# Patient Record
Sex: Male | Born: 1995 | Race: White | Hispanic: No | Marital: Single | Smoking: Never smoker
Health system: Southern US, Community
[De-identification: ages and names within clinical notes are randomized; demographics above are authoritative.]

## PROBLEM LIST (undated history)

## (undated) DIAGNOSIS — N200 Calculus of kidney: Secondary | ICD-10-CM

## (undated) DIAGNOSIS — K219 Gastro-esophageal reflux disease without esophagitis: Secondary | ICD-10-CM

## (undated) DIAGNOSIS — F329 Major depressive disorder, single episode, unspecified: Secondary | ICD-10-CM

## (undated) DIAGNOSIS — K589 Irritable bowel syndrome without diarrhea: Secondary | ICD-10-CM

## (undated) DIAGNOSIS — F419 Anxiety disorder, unspecified: Secondary | ICD-10-CM

## (undated) DIAGNOSIS — F32A Depression, unspecified: Secondary | ICD-10-CM

## (undated) DIAGNOSIS — T7840XA Allergy, unspecified, initial encounter: Secondary | ICD-10-CM

## (undated) DIAGNOSIS — E785 Hyperlipidemia, unspecified: Secondary | ICD-10-CM

## (undated) HISTORY — DX: Calculus of kidney: N20.0

## (undated) HISTORY — DX: Major depressive disorder, single episode, unspecified: F32.9

## (undated) HISTORY — PX: NO PAST SURGERIES: SHX2092

## (undated) HISTORY — DX: Anxiety disorder, unspecified: F41.9

## (undated) HISTORY — DX: Irritable bowel syndrome without diarrhea: K58.9

## (undated) HISTORY — DX: Depression, unspecified: F32.A

## (undated) HISTORY — DX: Gastro-esophageal reflux disease without esophagitis: K21.9

## (undated) HISTORY — DX: Allergy, unspecified, initial encounter: T78.40XA

## (undated) HISTORY — DX: Hyperlipidemia, unspecified: E78.5

---
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
## 2016-01-13 ENCOUNTER — Ambulatory Visit: Payer: 59 | Admitting: Family Medicine

## 2016-02-10 ENCOUNTER — Ambulatory Visit (INDEPENDENT_AMBULATORY_CARE_PROVIDER_SITE_OTHER): Payer: 59 | Admitting: Family Medicine

## 2016-02-10 ENCOUNTER — Encounter: Payer: Self-pay | Admitting: Family Medicine

## 2016-02-10 VITALS — BP 126/82 | HR 78 | Temp 98.3°F | Ht 65.0 in | Wt 133.5 lb

## 2016-02-10 DIAGNOSIS — Z0000 Encounter for general adult medical examination without abnormal findings: Secondary | ICD-10-CM | POA: Diagnosis not present

## 2016-02-10 DIAGNOSIS — Z139 Encounter for screening, unspecified: Secondary | ICD-10-CM

## 2016-02-10 DIAGNOSIS — Z1389 Encounter for screening for other disorder: Secondary | ICD-10-CM

## 2016-02-10 LAB — POCT URINALYSIS DIPSTICK
BILIRUBIN UA: NEGATIVE
Blood, UA: NEGATIVE
GLUCOSE UA: NEGATIVE
Ketones, UA: NEGATIVE
LEUKOCYTES UA: NEGATIVE
Nitrite, UA: NEGATIVE
Protein, UA: NEGATIVE
Spec Grav, UA: >=1.03
UROBILINOGEN UA: NEGATIVE
pH, UA: 6

## 2016-02-10 NOTE — Assessment & Plan Note (Signed)
Immunizations up to date. Regular exercise. Nonsmoker. Has had HIV screening. UA obtained today given ROS and for screening (was on college form). Form filled out for college today.

## 2016-02-10 NOTE — Progress Notes (Signed)
Subjective:  Patient ID: Shane Wu, male    DOB: [birth year]  Age: 20 y.o. MRN: [id number]  CC: Establish care/physical exam for school  HPI Shane SnareMatthew Wu is a 20 y.o. male presents to the clinic today to establish care. He is in need of a physical as he is transferring colleges.   Preventative Healthcare  Immunizations - Up to date per [place] database.  Exercise: Reports regular exercise.  Alcohol use: See below.  Smoking/tobacco use: No  STD/HIV testing: No concerns. Has had screening previously.   Regular dental exams: Yes.  Wears seat belt: Yes.  PMH, Surgical Hx, Family Hx, Social History reviewed and updated as below.  Past Medical History  Diagnosis Date  . Depression   . GERD (gastroesophageal reflux disease)   . Hyperlipidemia   . Kidney stones    Past Surgical History  Procedure Laterality Date  . No past surgeries     Family History  Problem Relation Age of Onset  . Mental illness Mother   . Mental illness Maternal Grandmother   . Mental illness Maternal Grandfather   . Hypertension Paternal Grandmother   . Diabetes Cousin    Social History  Substance Use Topics  . Smoking status: Never Smoker   . Smokeless tobacco: Never Used  . Alcohol Use: 0.6 - 1.2 oz/week    1-2 Standard drinks or equivalent per week   Review of Systems  Respiratory: Positive for chest tightness and shortness of breath.   Cardiovascular: Positive for palpitations.  Genitourinary: Positive for frequency.  All other systems reviewed and are negative.  Objective:   Today's Vitals: BP 126/82 mmHg  Pulse 78  Temp(Src) 98.3 F (36.8 C) (Oral)  Ht 5\' 5"  (1.651 m)  Wt 133 lb 8 oz (60.555 kg)  BMI 22.22 kg/m2  SpO2 97%  Physical Exam  Constitutional: He is oriented to person, place, and time. He appears well-developed and well-nourished. No distress.  HENT:  Head: Normocephalic and atraumatic.  Nose: Nose normal.  Mouth/Throat: Oropharynx is clear and moist. No  oropharyngeal exudate.  Normal TM's bilaterally.   Eyes: Conjunctivae are normal. No scleral icterus.  Neck: Neck supple. No thyromegaly present.  Cardiovascular: Normal rate and regular rhythm.   No murmur heard. Pulmonary/Chest: Effort normal and breath sounds normal. He has no wheezes. He has no rales.  Abdominal: Soft. He exhibits no distension. There is no tenderness. There is no rebound and no guarding.  Musculoskeletal: Normal range of motion. He exhibits no edema.  Lymphadenopathy:    He has no cervical adenopathy.  Neurological: He is alert and oriented to person, place, and time.  Skin: Skin is warm and dry. No rash noted.  Psychiatric: He has a normal mood and affect.  Vitals reviewed.  Assessment & Plan:   Problem List Items Addressed This Visit    Preventative health care    Immunizations up to date. Regular exercise. Nonsmoker. Has had HIV screening. UA obtained today given ROS and for screening (was on college form). Form filled out for college today.        Other Visit Diagnoses    Screening for blood or protein in urine    -  Primary    Relevant Orders    POCT Urinalysis Dipstick (Completed)       No outpatient encounter prescriptions on file as of 02/10/2016.   No facility-administered encounter medications on file as of 02/10/2016.    Follow-up: Annually/PRN  Everlene OtherJayce Marshall Roehrich DO [facility] [place]  [facility]

## 2016-02-10 NOTE — Progress Notes (Signed)
Pre visit review using our clinic review tool, if applicable. No additional management support is needed unless otherwise documented below in the visit note. 

## 2016-05-11 ENCOUNTER — Emergency Department: Payer: 59

## 2016-05-11 ENCOUNTER — Telehealth: Payer: Self-pay | Admitting: *Deleted

## 2016-05-11 ENCOUNTER — Emergency Department
Admission: EM | Admit: 2016-05-11 | Discharge: 2016-05-11 | Disposition: A | Discharge status: Home / Self Care | Payer: 59 | Attending: Emergency Medicine | Admitting: Emergency Medicine

## 2016-05-11 DIAGNOSIS — R109 Unspecified abdominal pain: Secondary | ICD-10-CM

## 2016-05-11 DIAGNOSIS — R112 Nausea with vomiting, unspecified: Secondary | ICD-10-CM | POA: Insufficient documentation

## 2016-05-11 DIAGNOSIS — R1031 Right lower quadrant pain: Secondary | ICD-10-CM | POA: Diagnosis not present

## 2016-05-11 LAB — CBC
HCT: 44.7 % (ref 40.0–52.0)
Hemoglobin: 15.6 g/dL (ref 13.0–18.0)
MCH: 28.5 pg (ref 26.0–34.0)
MCHC: 34.9 g/dL (ref 32.0–36.0)
MCV: 81.9 fL (ref 80.0–100.0)
PLATELETS: 298 10*3/uL (ref 150–440)
RBC: 5.46 MIL/uL (ref 4.40–5.90)
RDW: 12.6 % (ref 11.5–14.5)
WBC: 10.5 10*3/uL (ref 3.8–10.6)

## 2016-05-11 LAB — URINALYSIS COMPLETE WITH MICROSCOPIC (ARMC ONLY)
BACTERIA UA: NONE SEEN
BILIRUBIN URINE: NEGATIVE
GLUCOSE, UA: NEGATIVE mg/dL
HGB URINE DIPSTICK: NEGATIVE
Ketones, ur: NEGATIVE mg/dL
LEUKOCYTES UA: NEGATIVE
NITRITE: NEGATIVE
Protein, ur: NEGATIVE mg/dL
SPECIFIC GRAVITY, URINE: 1.027 (ref 1.005–1.030)
Squamous Epithelial / LPF: NONE SEEN
pH: 6 (ref 5.0–8.0)

## 2016-05-11 LAB — BASIC METABOLIC PANEL
Anion gap: 11 (ref 5–15)
BUN: 19 mg/dL (ref 6–20)
CHLORIDE: 101 mmol/L (ref 101–111)
CO2: 24 mmol/L (ref 22–32)
CREATININE: 1.28 mg/dL — AB (ref 0.61–1.24)
Calcium: 9.6 mg/dL (ref 8.9–10.3)
GFR calc Af Amer: >60 mL/min (ref >60)
GFR calc non Af Amer: >60 mL/min (ref >60)
GLUCOSE: 137 mg/dL — AB (ref 65–99)
Potassium: 3.5 mmol/L (ref 3.5–5.1)
SODIUM: 136 mmol/L (ref 135–145)

## 2016-05-11 MED ORDER — SODIUM CHLORIDE 0.9 % IV BOLUS (SEPSIS)
1000.0000 mL | Freq: Once | INTRAVENOUS | Status: AC
  Administered 2016-05-11: 1000 mL via INTRAVENOUS

## 2016-05-11 MED ORDER — KETOROLAC TROMETHAMINE 30 MG/ML IJ SOLN
30.0000 mg | Freq: Once | INTRAMUSCULAR | Status: AC
  Administered 2016-05-11: 30 mg via INTRAVENOUS

## 2016-05-11 MED ORDER — ONDANSETRON HCL 4 MG/2ML IJ SOLN
4.0000 mg | Freq: Once | INTRAMUSCULAR | Status: AC
  Administered 2016-05-11: 4 mg via INTRAVENOUS

## 2016-05-11 NOTE — ED Triage Notes (Signed)
Pt arrives to ER via POV c/o right sided flank pain sharp and stabbing. Denies urinary sx. Nausea and vomiting. Pt alert and oriented X4, active, cooperative, pt in NAD. RR even and unlabored, color WNL.

## 2016-05-11 NOTE — ED Provider Notes (Signed)
[facility] Emergency Department Provider Note  ____________________________________________  Time seen: Approximately 9:47 PM  I have reviewed the triage vital signs and the nursing notes.   HISTORY  Chief Complaint Flank Pain   HPI Shane Wu is a 20 y.o. male no significant past medical history who presents for evaluation of right flank pain. Patient reports that the pain started suddenly at [facility]. He characterizes the pain as sharp, constant, radiating to his right lower abdomen, 7-8/10. He reports multiple episodes of dry heaving and nausea. He denies hematuria, dysuria, fever, constipation, diarrhea. Patient has had a similar episode in 2015 when he was seen here but at that time had a CT scan with no evidence of a kidney stone. Patient reports that he was in his usual state of health until the pain started with normal appetite today.  Past Medical History:  Diagnosis Date  . Depression   . GERD (gastroesophageal reflux disease)   . Hyperlipidemia   . Kidney stones     Patient Active Problem List   Diagnosis Date Noted  . Preventative health care 02/10/2016    Past Surgical History:  Procedure Laterality Date  . NO PAST SURGERIES      Prior to Admission medications   Not on File    Allergies Review of patient's allergies indicates no known allergies.  Family History  Problem Relation Age of Onset  . Mental illness Mother   . Mental illness Maternal Grandmother   . Mental illness Maternal Grandfather   . Hypertension Paternal Grandmother   . Diabetes Cousin     Social History Social History  Substance Use Topics  . Smoking status: Never Smoker  . Smokeless tobacco: Never Used  . Alcohol use 0.6 - 1.2 oz/week    1 - 2 Standard drinks or equivalent per week    Review of Systems  Constitutional: Negative for fever. Eyes: Negative for visual changes. ENT: Negative for sore throat. Cardiovascular: Negative for chest  pain. Respiratory: Negative for shortness of breath. Gastrointestinal: Negative for abdominal pain,  Diarrhea. + N and vomiting Genitourinary: Negative for dysuria. + R flank pain Musculoskeletal: Negative for back pain. Skin: Negative for rash. Neurological: Negative for headaches, weakness or numbness.  ____________________________________________   PHYSICAL EXAM:  VITAL SIGNS: ED Triage Vitals  Enc Vitals Group     BP 05/11/16 2131 140/76     Pulse Rate 05/11/16 2131 (!) 101     Resp 05/11/16 2131 18     Temp 05/11/16 2131 97.6 F (36.4 C)     Temp Source 05/11/16 2131 Oral     SpO2 05/11/16 2131 98 %     Weight 05/11/16 2131 130 lb (59 kg)     Height 05/11/16 2131 5\' 5"  (1.651 m)     Head Circumference --      Peak Flow --      Pain Score 05/11/16 2132 7     Pain Loc --      Pain Edu? --      Excl. in GC? --     Constitutional: Alert and oriented. Well appearing and in no apparent distress. HEENT:      Head: Normocephalic and atraumatic.         Eyes: Conjunctivae are normal. Sclera is non-icteric. EOMI. PERRL      Mouth/Throat: Mucous membranes are moist.       Neck: Supple with no signs of meningismus. Cardiovascular: Regular rate and rhythm. No murmurs, gallops, or rubs.  2+ symmetrical distal pulses are present in all extremities. No JVD. Respiratory: Normal respiratory effort. Lungs are clear to auscultation bilaterally. No wheezes, crackles, or rhonchi.  Gastrointestinal: Soft, mild ttp over the RLQ, and non distended with positive bowel sounds. No rebound or guarding. Genitourinary: R CVA tenderness. Descended testes with no swelling or erythema of the scrotum, no tenderness over the testicles, positive bilateral cremasteric reflex, no evidence of inguinal hernia Musculoskeletal: Nontender with normal range of motion in all extremities. No edema, cyanosis, or erythema of extremities. Neurologic: Normal speech and language. Face is symmetric. Moving all  extremities. No gross focal neurologic deficits are appreciated. Skin: Skin is warm, dry and intact. No rash noted. Psychiatric: Mood and affect are normal. Speech and behavior are normal.  ____________________________________________   LABS (all labs ordered are listed, but only abnormal results are displayed)  Labs Reviewed  URINALYSIS COMPLETEWITH MICROSCOPIC ([facility] ONLY) - Abnormal; Notable for the following:       Result Value   Color, Urine YELLOW (*)    APPearance CLEAR (*)    All other components within normal limits  BASIC METABOLIC PANEL - Abnormal; Notable for the following:    Glucose, Bld 137 (*)    Creatinine, Ser 1.28 (*)    All other components within normal limits  URINE CULTURE  CBC   ____________________________________________  EKG  none  ____________________________________________  RADIOLOGY  CT renal:  Negative ____________________________________________   PROCEDURES  Procedure(s) performed: None Procedures Critical Care performed:  None ____________________________________________   INITIAL IMPRESSION / ASSESSMENT AND PLAN / ED COURSE  20 y.o. male no significant past medical history who presents for evaluation of sudden onset right flank pain radiating to the RLQ associated with nausea. Presentation concerning for kidney stone and less likely appendicitis. Plan for labs, UA, CT renal protocol. Will treat symptoms with IVF, IV zofran, and IV toradol.   Clinical Course  Comment By Time  CT negative. Labs and urine negative. Pain has resolved with toradol. Serial abdominal exmas with no tenderness, no guarding or rebound and completely benign abdomen. Plan to dc home with close re-check in the morning by PCP. Recommended return to the ER if pain returns, if he develops abdominal pain, fever, testicular pain or swelling or any new symptoms. Parents and patient comfortable with plan. Nita Sickle, MD 07/31 2258    Pertinent labs & imaging  results that were available during my care of the patient were reviewed by me and considered in my medical decision making (see chart for details).    ____________________________________________   FINAL CLINICAL IMPRESSION(S) / ED DIAGNOSES  Final diagnoses:  Right flank pain      NEW MEDICATIONS STARTED DURING THIS VISIT:  New Prescriptions   No medications on file     Note:  This document was prepared using Dragon voice recognition software and may include unintentional dictation errors.    Nita Sickle, MD 05/11/16 [phone]

## 2016-05-11 NOTE — ED Notes (Signed)
Patient transported to CT 

## 2016-05-11 NOTE — Telephone Encounter (Signed)
Patients mother requested pt bee seen for depression. She stated that pt has no been taking his medication properly and he has agreed to start the medication. Please give a time and date on Dr. Adriana Simas schedule to place pt for a office visit before August 11

## 2016-05-11 NOTE — Telephone Encounter (Signed)
30min please.

## 2016-05-11 NOTE — Telephone Encounter (Signed)
Shane Wu can you assist, I am not sure which slots are best to combine based on there requests for there template, thanks

## 2016-05-11 NOTE — Discharge Instructions (Signed)

## 2016-05-11 NOTE — Telephone Encounter (Signed)
Please advise if this can be a slot or needs to be 30? Thanks

## 2016-05-13 ENCOUNTER — Ambulatory Visit (INDEPENDENT_AMBULATORY_CARE_PROVIDER_SITE_OTHER): Payer: 59 | Admitting: Family Medicine

## 2016-05-13 ENCOUNTER — Encounter: Payer: Self-pay | Admitting: Family Medicine

## 2016-05-13 VITALS — BP 121/83 | HR 68 | Temp 98.2°F | Wt 130.0 lb

## 2016-05-13 DIAGNOSIS — F329 Major depressive disorder, single episode, unspecified: Secondary | ICD-10-CM

## 2016-05-13 DIAGNOSIS — R109 Unspecified abdominal pain: Secondary | ICD-10-CM

## 2016-05-13 DIAGNOSIS — F32A Depression, unspecified: Secondary | ICD-10-CM | POA: Insufficient documentation

## 2016-05-13 DIAGNOSIS — R10A1 Flank pain, right side: Secondary | ICD-10-CM | POA: Insufficient documentation

## 2016-05-13 LAB — POCT URINALYSIS DIPSTICK
BILIRUBIN UA: NEGATIVE
Blood, UA: NEGATIVE
Glucose, UA: NEGATIVE
Ketones, UA: NEGATIVE
LEUKOCYTES UA: NEGATIVE
NITRITE UA: NEGATIVE
PH UA: 6
PROTEIN UA: NEGATIVE
Spec Grav, UA: 1.025
Urobilinogen, UA: 0.2

## 2016-05-13 LAB — URINE CULTURE: Culture: NO GROWTH

## 2016-05-13 MED ORDER — CITALOPRAM HYDROBROMIDE 20 MG PO TABS: 20.0000 mg | ORAL_TABLET | Freq: Every day | ORAL | 1 refills | Status: DC

## 2016-05-13 NOTE — Progress Notes (Signed)
Subjective:  Patient ID: Shane Wu, male    DOB: 1996/02/22  Age: 20 y.o. MRN: [id number]  CC: Depression, Abdominal/flank pain  HPI:  20 year old male with a history of depression presents with the above complaints.  Depression  Patient has had a history of depression in the past.  He was on Zoloft previously and did well.  After doing well subsequently tapered and discontinued.  Recently he has been experiencing changes in his mood. He's been more down and more withdrawn.  He states that his girlfriend is going to college 4 hours away. This is the only new stressor that he's aware of.  He is in the process of switching colleges as well.  His mother has noticed a change in his behavior as well.  He states that he frequently gets upset and cries.  He would like to discuss treatment options today.  Abdominal/flank pain  Patient developed severe right flank pain/right lower abdominal pain on Monday. He had a similar issue in 2015.  He was seen and evaluated in the ED. CT scan was obtained and was negative.  He presents today for follow-up regarding this in addition to the above complaint.  He states that he continues to have pain in the right flank/right lower quadrant as well as the right low back.  Intermittent and mild.  He has had some constipation recently as well. He did have a bowel movement this morning.  No reports of hematuria. No known exacerbating or relieving factors. He had some nausea yesterday. No vomiting.  No other complaints at this time.  Social Hx   Social History   Social History  . Marital status: Single    Spouse name: N/A  . Number of children: N/A  . Years of education: N/A   Social History Main Topics  . Smoking status: Never Smoker  . Smokeless tobacco: Never Used  . Alcohol use 0.6 - 1.2 oz/week    1 - 2 Standard drinks or equivalent per week  . Drug use: No  . Sexual activity: Yes    Partners: Female   Other Topics  Concern  . None   Social History Narrative  . None   Review of Systems  Gastrointestinal: Positive for abdominal pain.  Genitourinary: Positive for flank pain.  Psychiatric/Behavioral:       Depression.   Objective:  BP 121/83 (BP Location: Right Arm, Patient Position: Sitting, Cuff Size: Normal)   Pulse 68   Temp 98.2 F (36.8 C) (Oral)   Wt 130 lb (59 kg)   SpO2 100%   BMI 21.63 kg/m   BP/Weight 05/13/2016 05/11/2016 02/10/2016  Systolic BP 121 132 126  Diastolic BP 83 70 82  Wt. (Lbs) 130 130 133.5  BMI 21.63 21.63 22.22   Physical Exam  Constitutional: He appears well-developed. No distress.  Cardiovascular: Normal rate and regular rhythm.   Pulmonary/Chest: Effort normal and breath sounds normal.  Abdominal: Soft.  Nondistended. Mildly tender palpation in the right lower quadrant. No rebound or guarding. No CVA tenderness.  Psychiatric:  Flat affect.  Vitals reviewed.  Lab Results  Component Value Date   WBC 10.5 05/11/2016   HGB 15.6 05/11/2016   HCT 44.7 05/11/2016   PLT 298 05/11/2016   GLUCOSE 137 (H) 05/11/2016   ALT 20 10/06/2014   AST 11 10/06/2014   NA 136 05/11/2016   K 3.5 05/11/2016   CL 101 05/11/2016   CREATININE 1.28 (H) 05/11/2016   BUN 19 05/11/2016  CO2 24 05/11/2016    Assessment & Plan:   Problem List Items Addressed This Visit    Depression - Primary    Established problem, recurrence. Starting on Celexa.      Relevant Medications   citalopram (CELEXA) 20 MG tablet   Right flank pain    New problem. Urinalysis negative today. Concern for possible stone although CT was negative. Advised supportive care and over-the-counter analgesics. If recurs will send to urology.      Relevant Orders   POCT Urinalysis Dipstick (Completed)    Other Visit Diagnoses   None.    Meds ordered this encounter  Medications  . citalopram (CELEXA) 20 MG tablet    Sig: Take 1 tablet (20 mg total) by mouth daily.    Dispense:  90 tablet      Refill:  1   Follow-up: 6 weeks   Everlene Other DO [facility]

## 2016-05-13 NOTE — Progress Notes (Signed)
Pre visit review using our clinic review tool, if applicable. No additional management support is needed unless otherwise documented below in the visit note. 

## 2016-05-13 NOTE — Assessment & Plan Note (Signed)
Established problem, recurrence. Starting on Celexa.

## 2016-05-13 NOTE — Patient Instructions (Signed)
Let me know if pain persists.  Take the celexa daily.  Follow up in 6 weeks.  Take care  Dr. Adriana Simas

## 2016-05-13 NOTE — Assessment & Plan Note (Signed)
New problem. Urinalysis negative today. Concern for possible stone although CT was negative. Advised supportive care and over-the-counter analgesics. If recurs will send to urology.

## 2016-07-13 ENCOUNTER — Telehealth: Payer: Self-pay | Admitting: Family Medicine

## 2016-07-13 NOTE — Telephone Encounter (Signed)
You can schedule tomorrow in either of the two acute slots I have open. Make for 15 min appt.

## 2016-07-13 NOTE — Telephone Encounter (Signed)
Pt is away at school and will only be home on Friday 10/6.

## 2016-07-13 NOTE — Telephone Encounter (Signed)
Pt mom Selena BattenKim called stating that pt will be home on Friday and would like a to make an appt to come in for some knee pain (mom not sure of which). Also stated that citalopram (CELEXA) 20 MG tablet is giving him some stomach pain and dirrhrea and some occasional vomiting.  If we cannot get him in can we please call in a different medication? Please Advise. Thank you!  Call mom (kim) [phone]

## 2016-07-13 NOTE — Telephone Encounter (Signed)
Patient is scheduled for 1:15 on Friday.

## 2016-07-13 NOTE — Telephone Encounter (Signed)
Put him in the 1115 spot or 115 on that date.

## 2016-07-17 ENCOUNTER — Ambulatory Visit: Payer: Self-pay | Admitting: Family Medicine

## 2016-08-02 IMAGING — CT CT ABD-PELV W/O CM
1 of 4 series · 5 of 46 positions shown, 10 images · non-contrast
Comparison: Right upper quadrant abdominal ultrasound performed
07/18/2013

CLINICAL DATA: Acute onset of left flank pain for 20 minutes.
Vomiting. Initial encounter.

EXAM:
CT ABDOMEN AND PELVIS WITHOUT CONTRAST
TECHNIQUE: Multidetector CT imaging of the abdomen and pelvis was performed
following the standard protocol without IV contrast.

[Series 4: lung windows · axial · 0.61mm/px · z∈[-146,-76]mm · 5 of 22 slices shown, 10 images]
[im 4/22  soft-tissue]
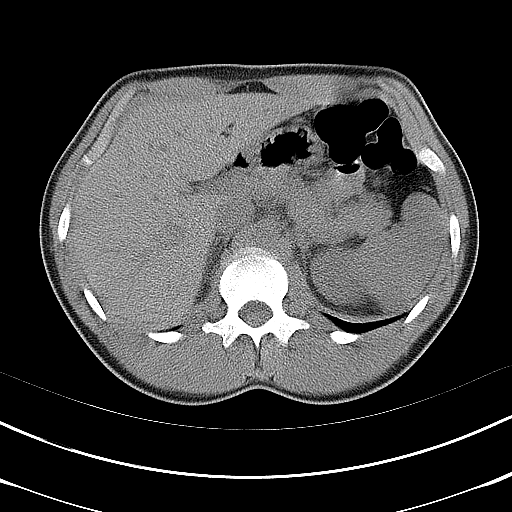
[im 4/22  bone]
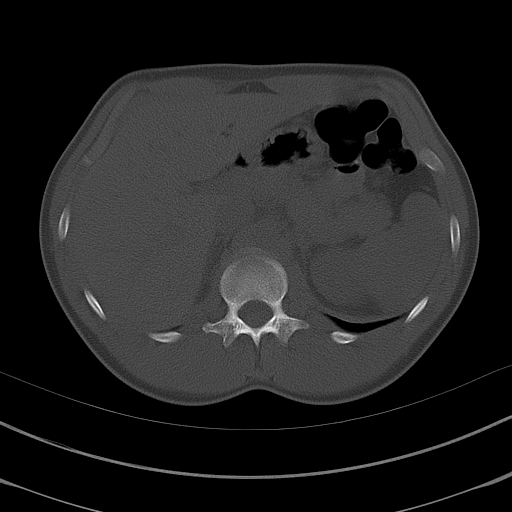
[im 8/22  soft-tissue]
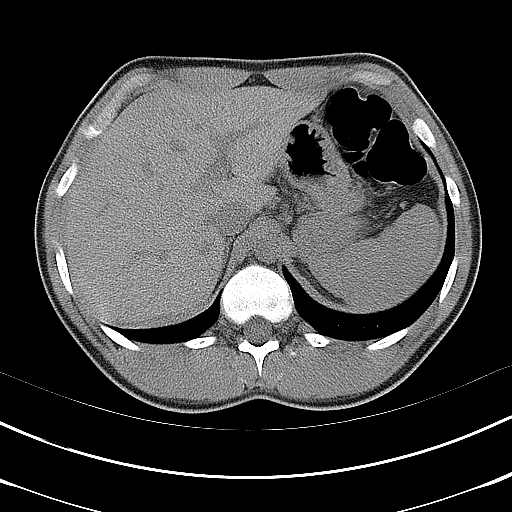
[im 8/22  lung]
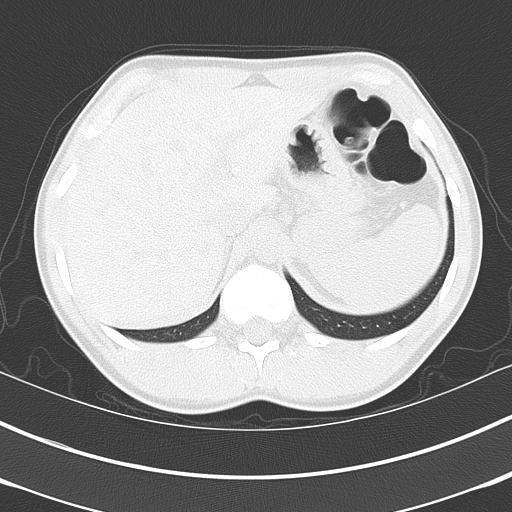
[im 11/22  soft-tissue]
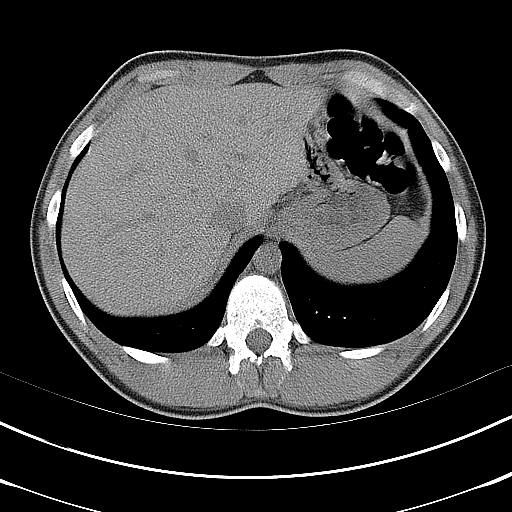
[im 11/22  lung]
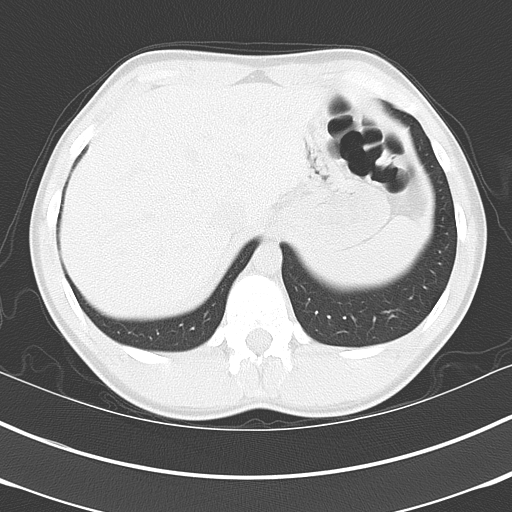
[im 15/22  soft-tissue]
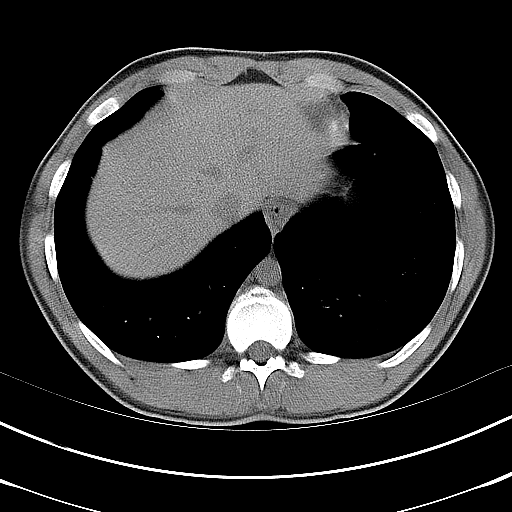
[im 15/22  lung]
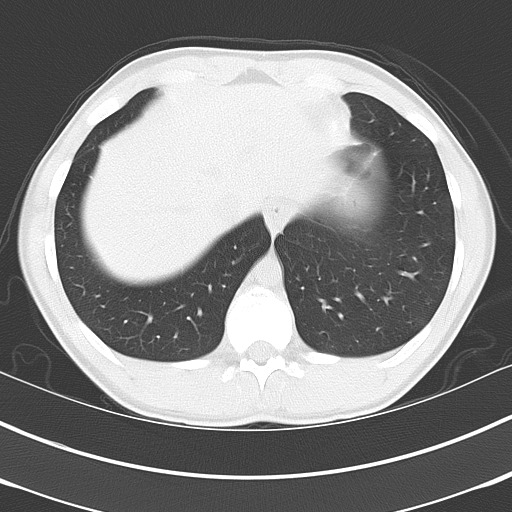
[im 18/22  soft-tissue]
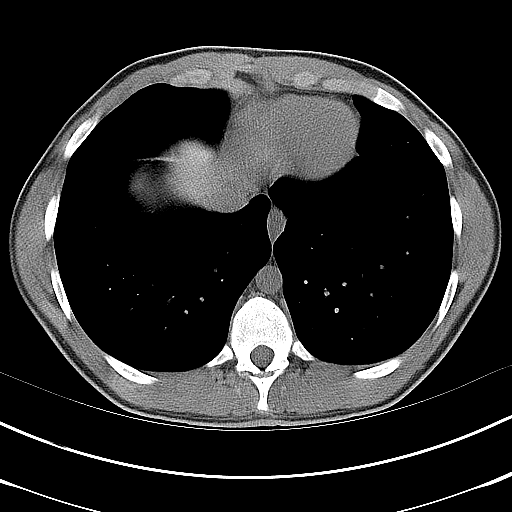
[im 18/22  lung]
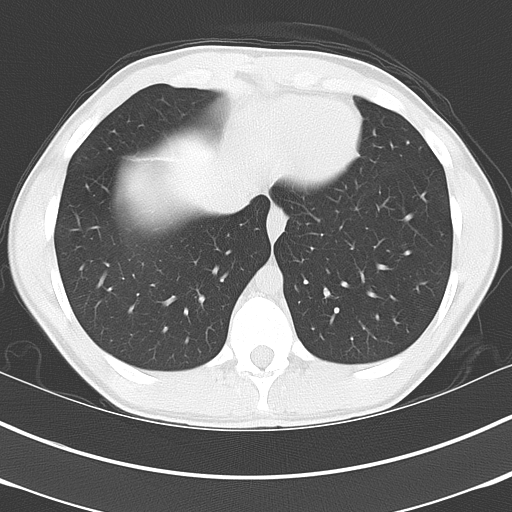

[5 of 46 positions shown; findings below may reference images not displayed]

FINDINGS: The visualized lung bases are clear.

The liver and spleen are unremarkable in appearance. The gallbladder
is within normal limits. The pancreas and adrenal glands are
unremarkable.

The kidneys are unremarkable in appearance. There is no evidence of
hydronephrosis. No renal or ureteral stones are seen. No perinephric
stranding is appreciated.

No free fluid is identified. The small bowel is unremarkable in
appearance. The stomach is within normal limits. No acute vascular
abnormalities are seen.

The appendix is difficult to fully characterize, but there is no
evidence for appendicitis. The colon is unremarkable in appearance.

The bladder is mildly distended and grossly unremarkable. A small
urachal remnant is incidentally seen. The prostate remains normal in
size. No inguinal lymphadenopathy is seen.

No acute osseous abnormalities are identified.
IMPRESSION: Unremarkable noncontrast CT of the abdomen and pelvis.

## 2018-03-08 IMAGING — CT CT RENAL STONE PROTOCOL
2 of 4 series · 15 of 46 positions shown, 17 images · non-contrast
Comparison: CT 10/06/2014

CLINICAL DATA: Sharp right flank pain.

EXAM:
CT ABDOMEN AND PELVIS WITHOUT CONTRAST
TECHNIQUE: Multidetector CT imaging of the abdomen and pelvis was performed
following the standard protocol without IV contrast.

[Series 2: axial st · axial · 0.59mm/px · z∈[-1052,-672]mm · 12 of 84 slices shown, 14 images]
[im 4/84  soft-tissue]
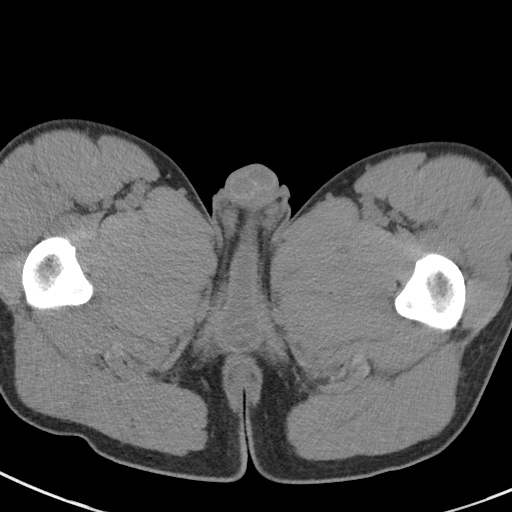
[im 4/84  bone]
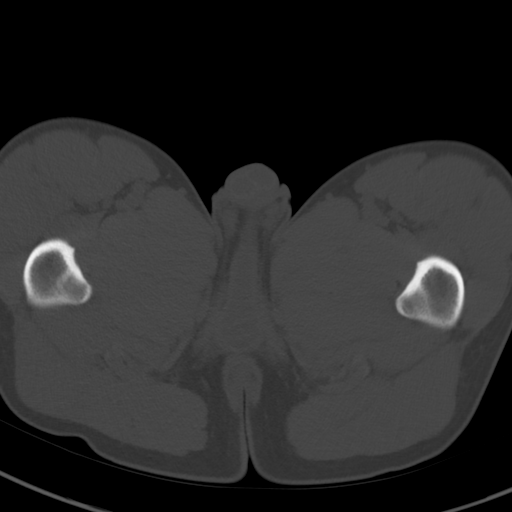
[im 11/84  soft-tissue]
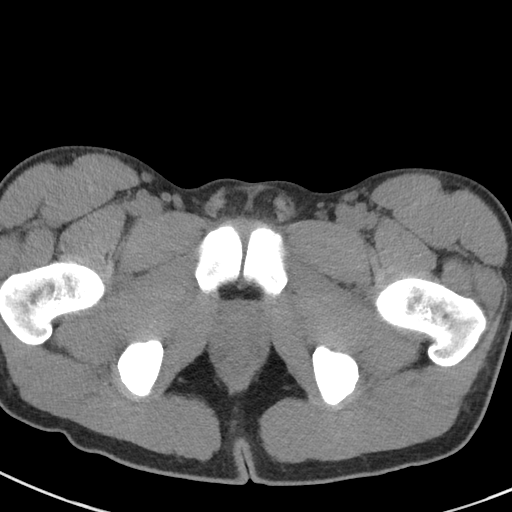
[im 18/84  soft-tissue]
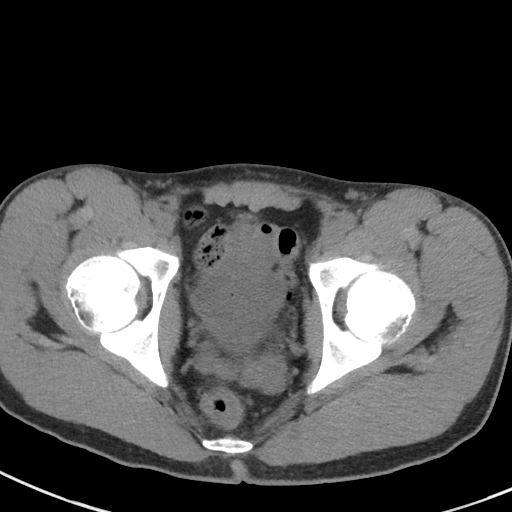
[im 25/84  soft-tissue]
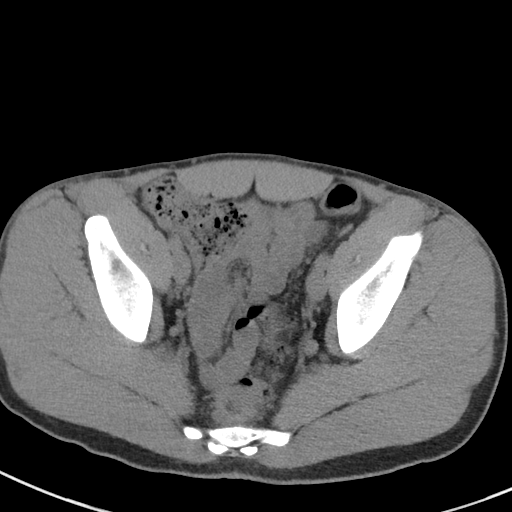
[im 32/84  soft-tissue]
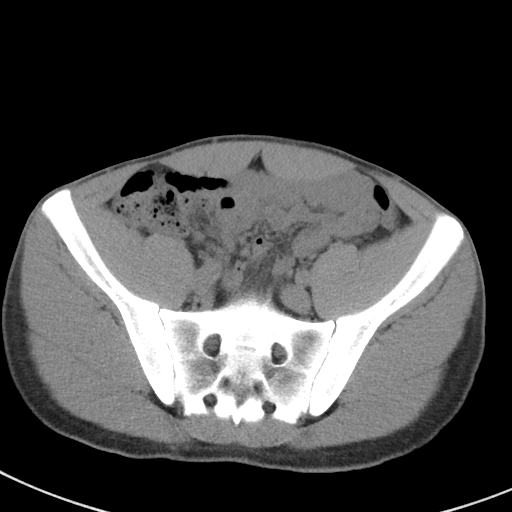
[im 39/84  soft-tissue]
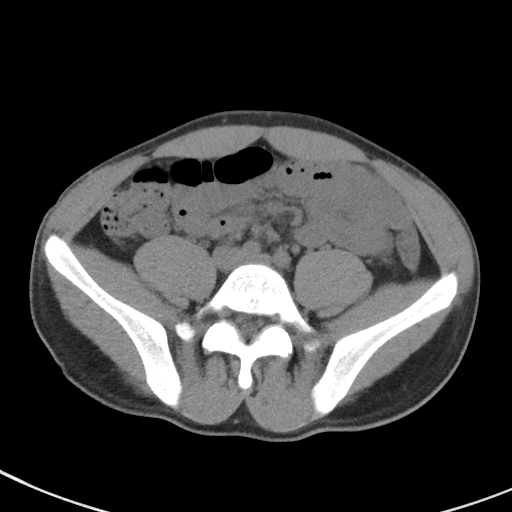
[im 45/84  soft-tissue]
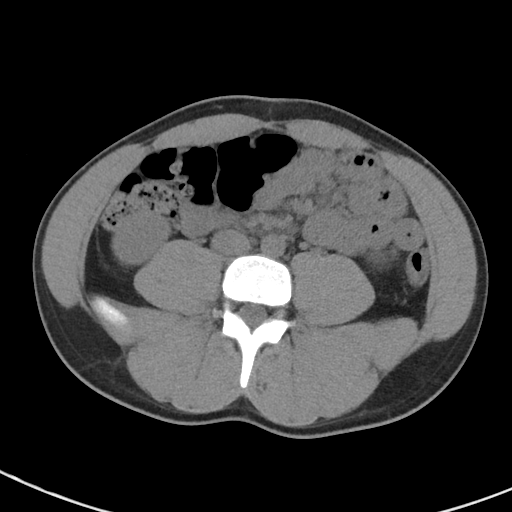
[im 52/84  soft-tissue]
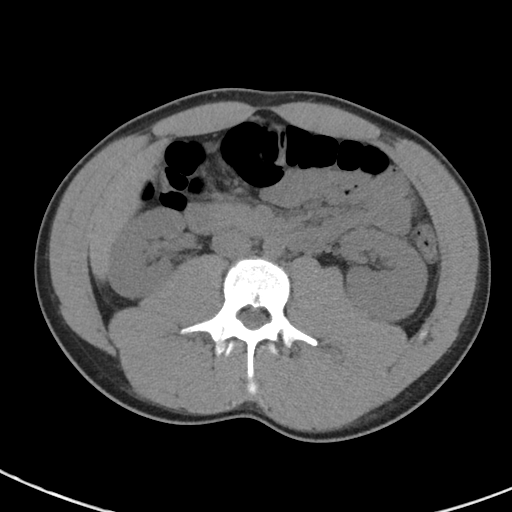
[im 59/84  soft-tissue]
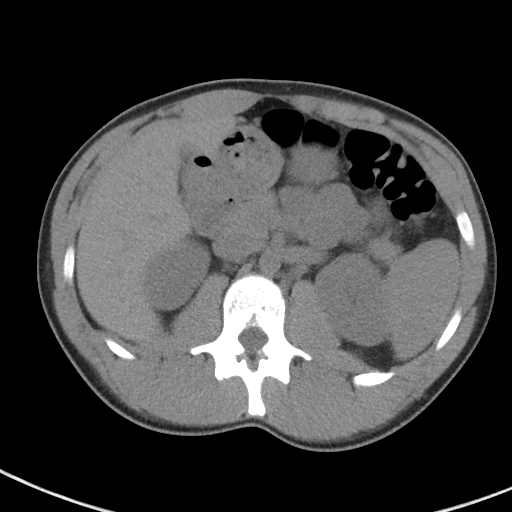
[im 59/84  bone]
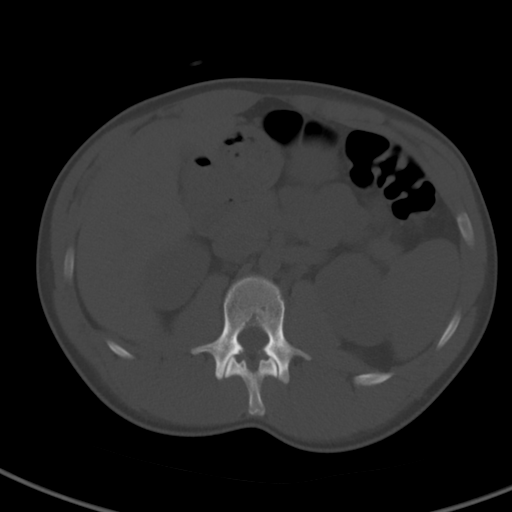
[im 66/84  soft-tissue]
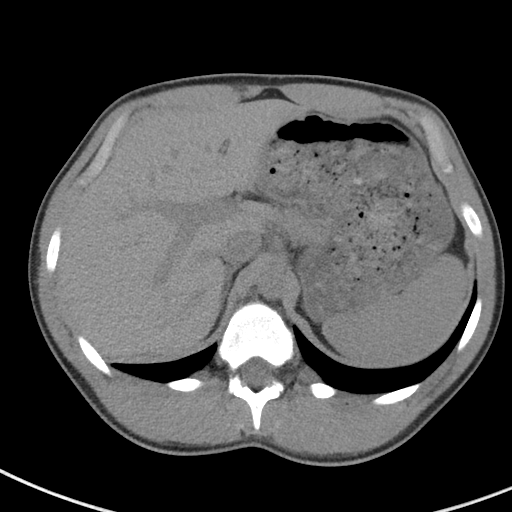
[im 73/84  soft-tissue]
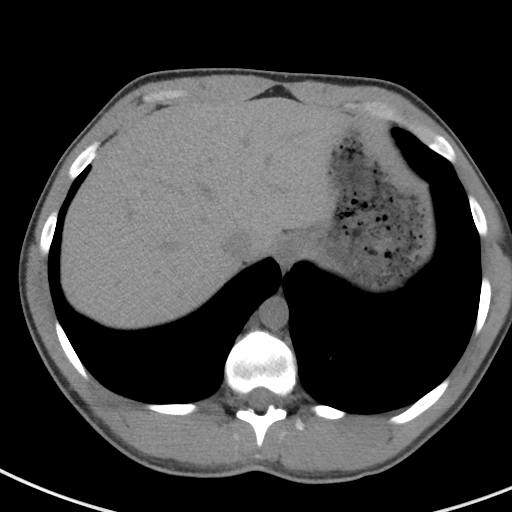
[im 80/84  soft-tissue]
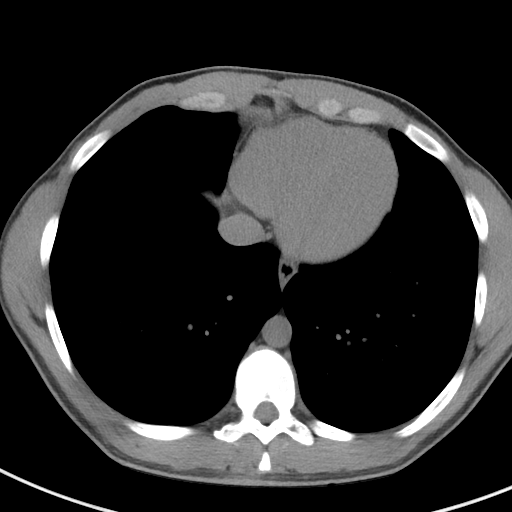

[Series 4: coronal st · coronal · 0.63mm/px · 3 of 82 slices shown]
[im 28/82  soft-tissue]
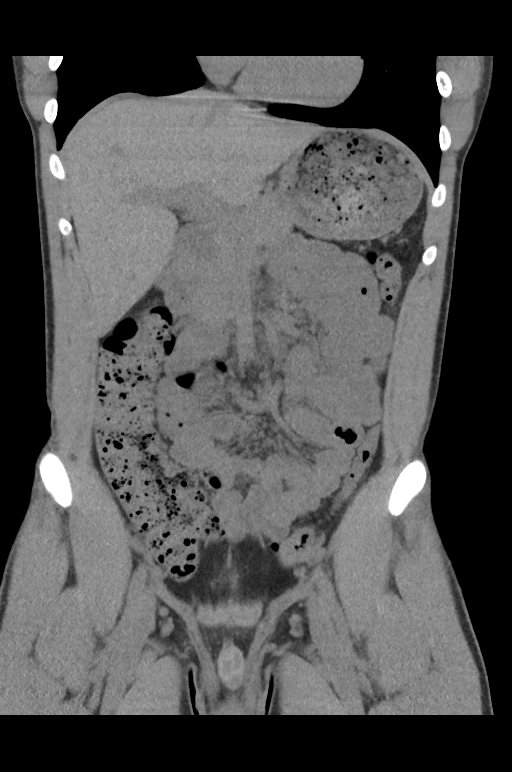
[im 37/82  soft-tissue]
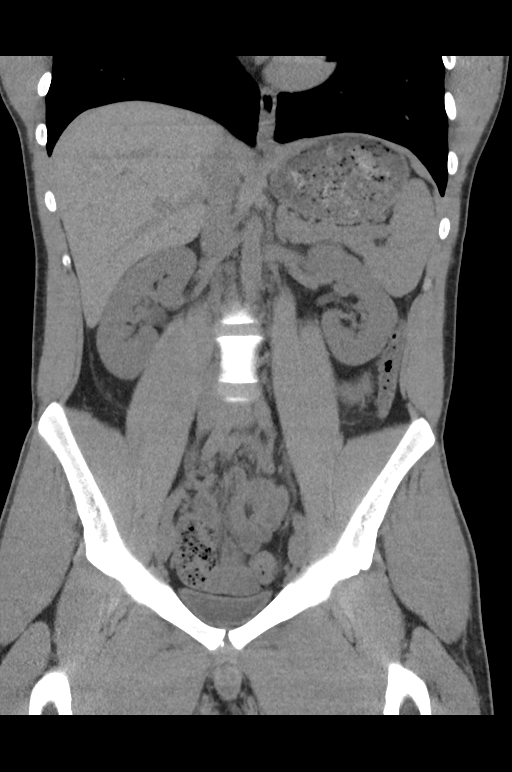
[im 46/82  soft-tissue]
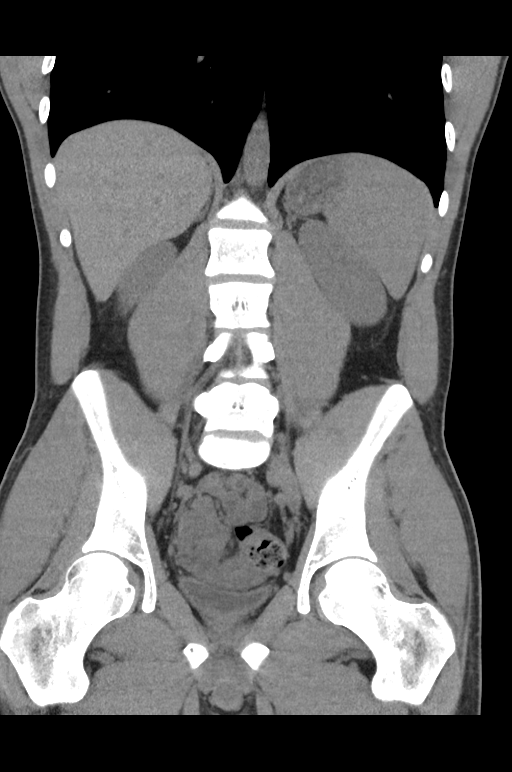

[15 of 46 positions shown; findings below may reference images not displayed]

FINDINGS: Lower chest:  The included lung bases are clear.

Liver: Normal noncontrast appearance.

Hepatobiliary: Gallbladder is decompressed.  No biliary dilatation.

Pancreas: Suboptimally assessed due to lack contrast and paucity of
intra-abdominal fat. No ductal dilatation or inflammation.

Spleen: Normal.

Adrenal glands: No nodule.

Kidneys: Symmetric in size without stones or hydronephrosis. There
is no perinephric stranding. Both ureters are decompressed without
stones along the course.

Stomach/Bowel: Stomach distended with ingested contents. There are
no dilated or thickened small bowel loops. Small volume of stool
throughout the colon without colonic wall thickening. The appendix
is not confidently identified, no pericecal or right lower quadrant
inflammation.

Vascular/Lymphatic: No retroperitoneal adenopathy. Abdominal aorta
is normal in caliber.

Reproductive: No acute abnormality.

Bladder: Minimally distended, no bladder stone. No definite wall
thickening.

Other: No free air, free fluid, or intra-abdominal fluid collection.

Musculoskeletal: There are no acute or suspicious osseous
abnormalities.
IMPRESSION: No renal stones or obstructive uropathy. No acute abnormality in the
abdomen/pelvis.

## 2019-03-21 ENCOUNTER — Ambulatory Visit (INDEPENDENT_AMBULATORY_CARE_PROVIDER_SITE_OTHER): Payer: Managed Care, Other (non HMO) | Admitting: Physician Assistant

## 2019-03-21 ENCOUNTER — Encounter: Payer: Self-pay | Admitting: Physician Assistant

## 2019-03-21 ENCOUNTER — Other Ambulatory Visit: Payer: Self-pay

## 2019-03-21 VITALS — BP 116/70 | HR 78 | Temp 98.1°F | Resp 16 | Ht 65.0 in | Wt 139.0 lb

## 2019-03-21 DIAGNOSIS — F329 Major depressive disorder, single episode, unspecified: Secondary | ICD-10-CM | POA: Diagnosis not present

## 2019-03-21 DIAGNOSIS — F32A Depression, unspecified: Secondary | ICD-10-CM

## 2019-03-21 MED ORDER — BUSPIRONE HCL 7.5 MG PO TABS: 7.5000 mg | ORAL_TABLET | Freq: Two times a day (BID) | ORAL | 0 refills | Status: AC

## 2019-03-21 NOTE — Patient Instructions (Signed)
Persistent Depressive Disorder  Persistent depressive disorder (PDD) is a mental health condition. PDD causes symptoms of low-level depression for 2 years or longer. It may also be called long-term (chronic) depression or dysthymia. PDD may include episodes of more severe depression that last for about 2 weeks (major depressive disorder or MDD). PDD can affect the way you think, feel, and sleep. This condition may also affect your relationships. You may be more likely to get sick if you have PDD. Symptoms of PDD occur for most of the day and may include:  Feeling tired (fatigue).  Low energy.  Eating too much or too little.  Sleeping too much or too little.  Feeling restless or agitated.  Feeling hopeless.  Feeling worthless or guilty.  Feeling worried or nervous (anxiety).  Trouble concentrating or making decisions.  Low self-esteem.  A negative way of looking at things (outlook).  Not being able to have fun or feel pleasure.  Avoiding interacting with people.  Getting angry or annoyed easily (irritability).  Acting aggressive or angry. Follow these instructions at home: Activity  Go back to your normal activities as told by your doctor.  Exercise regularly as told by your doctor. General instructions  Take over-the-counter and prescription medicines only as told by your doctor.  Do not drink alcohol. Or, limit how much alcohol you drink to no more than 1 drink a day for nonpregnant women and 2 drinks a day for men. One drink equals 12 oz of beer, 5 oz of wine, or 1 oz of hard liquor. Alcohol can affect any antidepressant medicines you are taking. Talk with your doctor about your alcohol use.  Eat a healthy diet and get plenty of sleep.  Find activities that you enjoy each day.  Consider joining a support group. Your doctor may be able to suggest a support group.  Keep all follow-up visits as told by your doctor. This is important. Where to find more  information National Alliance on Mental Illness  www.nami.org [place] National Institute of Mental Health  www.nimh.nih.gov National Suicide Prevention Lifeline  (1-800-273-8255).  This is free, 24-hour help. Contact a doctor if:  Your symptoms get worse.  You have new symptoms.  You have trouble sleeping or doing your daily activities. Get help right away if:  You self-harm.  You have serious thoughts about hurting yourself or others.  You see, hear, taste, smell, or feel things that are not there (hallucinate). This information is not intended to replace advice given to you by your health care provider. Make sure you discuss any questions you have with your health care provider. Document Released: 09/09/2015 Document Revised: 05/22/2016 Document Reviewed: 05/22/2016 Elsevier Interactive Patient Education  2019 [employer] Inc.  

## 2019-03-21 NOTE — Progress Notes (Signed)
Patient: Shane Wu, Male    DOB: 25-Sep-1996, 23 y.o.   MRN: [id number] Visit Date: 03/21/2019  Today's Provider: Trey SailorsAdriana M Exilda Wilhite, PA-C   Chief Complaint  Patient presents with  . New Patient (Initial Visit)  . Depression   Subjective:     Annual physical exam Shane Wu is a 23 y.o. male who presents today for health maintenance. Patient c/o depression worsening in the last few weeks. Patient reports that about two years ago was taking a medication for depression.   [place]ives in [place]. [place] in [place] and social [employer] compliance. Living back with parents and working from home. No drugs or history.  Are you in relationship. Relationship with male. Not sexually active currently, has been sexually active in the past.  Had history of depression in college that was on and off. [employer] transferred to [employer]. Reports he was on medication but it made him tired. Took medication for two months. Has taken celexa and zoloft. Reports he had stomach issues and grogginess. Has seen counselor in the past, does wish to see another one but is not sure if he will relocate back to [place].  Reports he has bilateral ear fullness. Reports he takes flonase daily.  -----------------------------------------------------------------   Review of Systems  Constitutional: Positive for appetite change and fatigue.  HENT: Negative.   Eyes: Negative.   Respiratory: Negative.   Cardiovascular: Negative.   Gastrointestinal: Negative.   Endocrine: Negative.   Genitourinary: Negative.   Musculoskeletal: Positive for back pain, neck pain and neck stiffness.  Skin: Negative.   Allergic/Immunologic: Negative.   Neurological: Positive for headaches.  Hematological: Negative.   Psychiatric/Behavioral: Positive for agitation, decreased concentration and sleep disturbance. The patient is nervous/anxious.     Social History      He  reports that he has never smoked.  He has never used smokeless tobacco. He reports current alcohol use of about 1.0 - 2.0 standard drinks of alcohol per week. He reports that he does not use drugs.       Social History   Socioeconomic History  . Marital status: Single    Spouse name: Not on file  . Number of children: Not on file  . Years of education: Not on file  . Highest education level: Not on file  Occupational History  . Not on file  Social Needs  . Financial resource strain: Not on file  . Food insecurity:    Worry: Not on file    Inability: Not on file  . Transportation needs:    Medical: Not on file    Non-medical: Not on file  Tobacco Use  . Smoking status: Never Smoker  . Smokeless tobacco: Never Used  Substance and Sexual Activity  . Alcohol use: Yes    Alcohol/week: 1.0 - 2.0 standard drinks    Types: 1 - 2 Standard drinks or equivalent per week  . Drug use: No  . Sexual activity: Yes    Partners: Female  Lifestyle  . Physical activity:    Days per week: Not on file    Minutes per session: Not on file  . Stress: Not on file  Relationships  . Social connections:    Talks on phone: Not on file    Gets together: Not on file    Attends religious service: Not on file    Active member of club or organization: Not on file    Attends meetings of  clubs or organizations: Not on file    Relationship status: Not on file  Other Topics Concern  . Not on file  Social History Narrative  . Not on file    Past Medical History:  Diagnosis Date  . Allergy   . Anxiety   . Depression   . GERD (gastroesophageal reflux disease)   . Hyperlipidemia   . IBS (irritable bowel syndrome)   . Kidney stones      Patient Active Problem List   Diagnosis Date Noted  . Depression 05/13/2016  . Right flank pain 05/13/2016  . Preventative health care 02/10/2016    Past Surgical History:  Procedure Laterality Date  . NO PAST SURGERIES      Family History        Family Status  Relation Name Status  .  Mother  Alive  . MGM  (Not Specified)  . MGF  (Not Specified)  . PGM  (Not Specified)  . Cousin  (Not Specified)  . Father  Alive        His family history includes Depression in his maternal grandmother and mother; Diabetes in his cousin; Hypertension in his paternal grandmother; Mental illness in his maternal grandfather, maternal grandmother, and mother.      No Known Allergies  No current outpatient medications on file.   Patient Care Team: Maryella ShiversPollak, Carless Slatten M, PA-C as PCP - General (Physician Assistant)    Objective:    Vitals: BP 116/70 (BP Location: Left Arm, Patient Position: Sitting, Cuff Size: Normal)   Pulse 78   Temp 98.1 F (36.7 C) (Oral)   Resp 16   Ht 5\' 5"  (1.651 m)   Wt 139 lb (63 kg)   BMI 23.13 kg/m    Vitals:   03/21/19 1537  BP: 116/70  Pulse: 78  Resp: 16  Temp: 98.1 F (36.7 C)  TempSrc: Oral  Weight: 139 lb (63 kg)  Height: 5\' 5"  (1.651 m)     Physical Exam Constitutional:      Appearance: Normal appearance.  HENT:     Right Ear: Tympanic membrane and ear canal normal. There is no impacted cerumen.     Left Ear: Tympanic membrane and ear canal normal. There is no impacted cerumen.  Cardiovascular:     Rate and Rhythm: Normal rate and regular rhythm.     Heart sounds: Normal heart sounds.  Pulmonary:     Effort: Pulmonary effort is normal.     Breath sounds: Normal breath sounds.  Skin:    General: Skin is warm and dry.  Neurological:     Mental Status: He is alert.  Psychiatric:        Mood and Affect: Mood normal.        Behavior: Behavior normal.      Depression Screen PHQ 2/9 Scores 03/21/2019  PHQ - 2 Score 6  PHQ- 9 Score 19       Assessment & Plan:     Routine Health Maintenance and Physical Exam  Exercise Activities and Dietary recommendations Goals   None     Immunization History  Administered Date(s) Administered  . Tdap 11/10/2013    Health Maintenance  Topic Date Due  . HIV Screening  06/22/2011   . INFLUENZA VACCINE  05/13/2019  . TETANUS/TDAP  11/11/2023     Discussed health benefits of physical activity, and encouraged him to engage in regular exercise appropriate for his age and condition.    1. Depression, unspecified depression type  -  busPIRone (BUSPAR) 7.5 MG tablet; Take 1 tablet (7.5 mg total) by mouth 2 (two) times daily.  Dispense: 180 tablet; Refill: 0  The entirety of the information documented in the History of Present Illness, Review of Systems and Physical Exam were personally obtained by me. Portions of this information were initially documented by Lynford Humphrey, CMA and reviewed by me for thoroughness and accuracy.   F/u 1 month for depression --------------------------------------------------------------------    Trinna Post, PA-C  [facility]

## 2019-04-20 ENCOUNTER — Other Ambulatory Visit: Payer: Self-pay

## 2019-04-20 ENCOUNTER — Ambulatory Visit (INDEPENDENT_AMBULATORY_CARE_PROVIDER_SITE_OTHER): Payer: Managed Care, Other (non HMO) | Admitting: Physician Assistant

## 2019-04-20 VITALS — BP 125/82 | HR 96 | Temp 98.0°F | Resp 16 | Wt 139.6 lb

## 2019-04-20 DIAGNOSIS — F32A Depression, unspecified: Secondary | ICD-10-CM

## 2019-04-20 DIAGNOSIS — R12 Heartburn: Secondary | ICD-10-CM

## 2019-04-20 DIAGNOSIS — F329 Major depressive disorder, single episode, unspecified: Secondary | ICD-10-CM

## 2019-04-20 MED ORDER — SERTRALINE HCL 50 MG PO TABS: 50.0000 mg | ORAL_TABLET | Freq: Every day | ORAL | 1 refills | Status: DC

## 2019-04-20 NOTE — Progress Notes (Signed)
Patient: Shane Wu Male    DOB: 12-09-1995   23 y.o.   MRN: [id number] Visit Date: 04/20/2019  Today's Provider: Trinna Post, PA-C   Chief Complaint  Patient presents with  . Follow-up   Subjective:     HPI  Follow up for depression  The patient was last seen for this 1 months ago. Changes made at last visit include start buspirone 7.5 mg BID.   He reports excellent compliance with treatment. He feels that condition is Unchanged. He is not having side effects.   He is open to counseling today. He thinks the previous medications that he tried may not have worked because he didn't take them long enough or consistently enough.   Depression screen [facility] 2/9 04/20/2019 03/21/2019  Decreased Interest 3 3  Down, Depressed, Hopeless 3 3  PHQ - 2 Score 6 6  Altered sleeping 1 3  Tired, decreased energy 3 3  Change in appetite 0 1  Feeling bad or failure about yourself  3 2  Trouble concentrating 2 3  Moving slowly or fidgety/restless 0 1  Suicidal thoughts 0 0  PHQ-9 Score 15 19  Difficult doing work/chores Extremely dIfficult Very difficult    ------------------------------------------------------------------------------------   No Known Allergies   Current Outpatient Medications:  .  busPIRone (BUSPAR) 7.5 MG tablet, Take 1 tablet (7.5 mg total) by mouth 2 (two) times daily., Disp: 180 tablet, Rfl: 0  Review of Systems  Social History   Tobacco Use  . Smoking status: Never Smoker  . Smokeless tobacco: Never Used  Substance Use Topics  . Alcohol use: Yes    Alcohol/week: 1.0 - 2.0 standard drinks    Types: 1 - 2 Standard drinks or equivalent per week      Objective:   BP 125/82 (BP Location: Left Arm, Patient Position: Sitting, Cuff Size: Normal)   Pulse 96   Temp 98 F (36.7 C) (Oral)   Resp 16   Wt 139 lb 9.6 oz (63.3 kg)   BMI 23.23 kg/m  Vitals:   04/20/19 1123  BP: 125/82  Pulse: 96  Resp: 16  Temp: 98 F (36.7 C)  TempSrc:  Oral  Weight: 139 lb 9.6 oz (63.3 kg)     Physical Exam Constitutional:      Appearance: Normal appearance.  Cardiovascular:     Rate and Rhythm: Normal rate.  Pulmonary:     Effort: Pulmonary effort is normal.  Skin:    General: Skin is warm and dry.  Neurological:     Mental Status: He is alert and oriented to person, place, and time. Mental status is at baseline.  Psychiatric:        Mood and Affect: Mood normal.        Behavior: Behavior normal.      No results found for any visits on 04/20/19.     Assessment & Plan    1. Depression, unspecified depression type  Will start zoloft as below. I will leave it up to him if he would like to continue or discontinue buspar, but he may take both safely together. Will refer for counseling.   - sertraline (ZOLOFT) 50 MG tablet; Take 1 tablet (50 mg total) by mouth daily.  Dispense: 90 tablet; Refill: 1 - Ambulatory referral to [facility]  2. Heartburn  Counseled about pepcid, omeprazole, avoiding triggers, avoiding NSAIDs.   The entirety of the information documented in the History of Present  Illness, Review of Systems and Physical Exam were personally obtained by me. Portions of this information were initially documented by Rondel BatonSulibeya Dimas, CMA and reviewed by me for thoroughness and accuracy.   F/u 2 months       Trey SailorsAdriana M Pollak, PA-C  [facility]

## 2019-04-20 NOTE — Patient Instructions (Signed)
Pepcid 20 mg on an empty stomach 30 min before a meal  Major Depressive Disorder, Adult Major depressive disorder (MDD) is a mental health condition. MDD often makes you feel sad, hopeless, or helpless. MDD can also cause symptoms in your body. MDD can affect your:  Work.  School.  Relationships.  Other normal activities. MDD can range from mild to very bad. It may occur once (single episode MDD). It can also occur many times (recurrent MDD). The main symptoms of MDD often include:  Feeling sad, depressed, or irritable most of the time.  Loss of interest. MDD symptoms also include:  Sleeping too much or too little.  Eating too much or too little.  A change in your weight.  Feeling tired (fatigue) or having low energy.  Feeling worthless.  Feeling guilty.  Trouble making decisions.  Trouble thinking clearly.  Thoughts of suicide or harming others.  Feeling weak.  Feeling agitated.  Keeping yourself from being around other people (isolation). Follow these instructions at home: Activity  Do these things as told by your doctor: ? Go back to your normal activities. ? Exercise regularly. ? Spend time outdoors. Alcohol  Talk with your doctor about how alcohol can affect your antidepressant medicines.  Do not drink alcohol. Or, limit how much alcohol you drink. ? This means no more than 1 drink a day for nonpregnant women and 2 drinks a day for men. One drink equals one of these:  12 oz of beer.  5 oz of wine.  1 oz of hard liquor. General instructions  Take over-the-counter and prescription medicines only as told by your doctor.  Eat a healthy diet.  Get plenty of sleep.  Find activities that you enjoy. Make time to do them.  Think about joining a support group. Your doctor may be able to suggest a group for you.  Keep all follow-up visits as told by your doctor. This is important. Where to find more information:  [employer] on Mental  Illness: ? www.nami.[place]: ? https://carter.com/  National Suicide Prevention Lifeline: ? 604-069-8673. This is free, 24-hour help. Contact a doctor if:  Your symptoms get worse.  You have new symptoms. Get help right away if:  You self-harm.  You see, hear, taste, smell, or feel things that are not present (hallucinate). If you ever feel like you may hurt yourself or others, or have thoughts about taking your own life, get help right away. You can go to your nearest emergency department or call:  Your local emergency services (911 in the [place].S.).  A suicide crisis helpline, such as the National Suicide Prevention Lifeline: ? 865-083-2425. This is open 24 hours a day. This information is not intended to replace advice given to you by your health care provider. Make sure you discuss any questions you have with your health care provider. Document Released: 09/09/2015 Document Revised: 09/10/2017 Document Reviewed: 06/14/2016 Elsevier Patient Education  2020 [employer].

## 2019-04-28 ENCOUNTER — Ambulatory Visit: Payer: Self-pay | Admitting: *Deleted

## 2019-04-28 NOTE — Chronic Care Management (AMB) (Signed)
   Chronic Care Management   Unsuccessful Call Note 04/28/2019 Name: Shane Wu MRN: [id number] DOB: 1995-12-29  Patient is a 23 year old male who sees Carles Collet, [place] for primary care. Carles Collet, PA-C asked the CCM team to consult the patient for Mental Health Counseling and Resources.  Referral was placed 04/20/19. Patient's last office visit was 04/20/19.     This social worker was unable to reach patient via telephone today for consent for CM service. I have left HIPAA compliant voicemail asking patient to return my call. (unsuccessful outreach #1).   Plan: Will follow-up within 7 business days via telephone.      Elliot Gurney, [place] Worker  [facility] [phone]

## 2019-05-08 ENCOUNTER — Ambulatory Visit: Payer: Self-pay | Admitting: *Deleted

## 2019-05-08 NOTE — Chronic Care Management (AMB) (Signed)
   Care Management   Unsuccessful Call Note 05/08/2019 Name: Shane Wu MRN: [id number] DOB: 1996/03/05  Patient is a 23 year old male who sees Carles Collet, [place] for primary care. Carles Collet, PA-C asked the CCM team to consult the patient for Mental Health Counseling and Resources.     This social worker was unable to reach patient via telephone today to obtain consent for CM Services. I have left HIPAA compliant voicemail asking patient to return my call. (unsuccessful outreach #2).   Plan: Will follow-up within 7 business days via telephone.      Elliot Gurney, [place] Worker  [facility] [phone]

## 2019-05-15 ENCOUNTER — Ambulatory Visit: Payer: Self-pay | Admitting: *Deleted

## 2019-05-15 NOTE — Chronic Care Management (AMB) (Signed)
   Care Management   Unsuccessful Call Note 05/15/2019 Name: Shane Wu MRN: [id number] DOB: Oct 10, 1996  Patient  is a 23 year old male who sees Carles Collet, [place] for primary care. Carles Collet, PA-Casked the CCM team to consult the patient for Mental Health Counseling and Resources.     This social worker was unable to reach patient via telephone today to consent for CM services. I have left HIPAA compliant voicemail asking patient to return my call. (unsuccessful outreach #3).   Plan: This Education officer, museum will cease further attempts to reach patient as this Education officer, museum has made 3 unsuccessful attempts to reach patient with no return call. This social worker will be happy to engage patient upon his return call.     Elliot Gurney, [place] Worker  [facility] [phone]

## 2019-06-22 ENCOUNTER — Ambulatory Visit (INDEPENDENT_AMBULATORY_CARE_PROVIDER_SITE_OTHER): Payer: Managed Care, Other (non HMO) | Admitting: Physician Assistant

## 2019-06-22 DIAGNOSIS — F32A Depression, unspecified: Secondary | ICD-10-CM

## 2019-06-22 DIAGNOSIS — F329 Major depressive disorder, single episode, unspecified: Secondary | ICD-10-CM | POA: Diagnosis not present

## 2019-06-22 MED ORDER — SERTRALINE HCL 50 MG PO TABS: 50.0000 mg | ORAL_TABLET | Freq: Every day | ORAL | 1 refills | Status: DC

## 2019-06-22 MED ORDER — SERTRALINE HCL 50 MG PO TABS: 50.0000 mg | ORAL_TABLET | Freq: Every day | ORAL | 0 refills | Status: DC

## 2019-06-22 NOTE — Patient Instructions (Signed)
Persistent Depressive Disorder  Persistent depressive disorder (PDD) is a mental health condition. PDD causes symptoms of low-level depression for 2 years or longer. It may also be called long-term (chronic) depression or dysthymia. PDD may include episodes of more severe depression that last for about 2 weeks (major depressive disorder or MDD). PDD can affect the way you think, feel, and sleep. This condition may also affect your relationships. You may be more likely to get sick if you have PDD. Symptoms of PDD occur for most of the day and may include:  Feeling tired (fatigue).  Low energy.  Eating too much or too little.  Sleeping too much or too little.  Feeling restless or agitated.  Feeling hopeless.  Feeling worthless or guilty.  Feeling worried or nervous (anxiety).  Trouble concentrating or making decisions.  Low self-esteem.  A negative way of looking at things (outlook).  Not being able to have fun or feel pleasure.  Avoiding interacting with people.  Getting angry or annoyed easily (irritability).  Acting aggressive or angry. Follow these instructions at home: Activity  Go back to your normal activities as told by your doctor.  Exercise regularly as told by your doctor. General instructions  Take over-the-counter and prescription medicines only as told by your doctor.  Do not drink alcohol. Or, limit how much alcohol you drink to no more than 1 drink a day for nonpregnant women and 2 drinks a day for men. One drink equals 12 oz of beer, 5 oz of wine, or 1 oz of hard liquor. Alcohol can affect any antidepressant medicines you are taking. Talk with your doctor about your alcohol use.  Eat a healthy diet and get plenty of sleep.  Find activities that you enjoy each day.  Consider joining a support group. Your doctor may be able to suggest a support group.  Keep all follow-up visits as told by your doctor. This is important. Where to find more  information National Alliance on Mental Illness  www.nami.org [place]. National Institute of Mental Health  www.nimh.nih.gov National Suicide Prevention Lifeline  (1-800-273-8255).  This is free, 24-hour help. Contact a doctor if:  Your symptoms get worse.  You have new symptoms.  You have trouble sleeping or doing your daily activities. Get help right away if:  You self-harm.  You have serious thoughts about hurting yourself or others.  You see, hear, taste, smell, or feel things that are not there (hallucinate). This information is not intended to replace advice given to you by your health care provider. Make sure you discuss any questions you have with your health care provider. Document Released: 09/09/2015 Document Revised: 09/10/2017 Document Reviewed: 05/22/2016 Elsevier Patient Education  2020 Elsevier Inc.  

## 2019-06-22 NOTE — Progress Notes (Signed)
   Subjective:    Patient ID: Shane Wu, male    DOB: 05/23/1996, 24 y.o.   MRN: [id number]  Shane Wu is a 23 y.o. male presenting on 06/22/2019 for Depression  Virtual Visit via Telephone Note  I connected with [name] on 06/22/19 at 11:20 AM EDT by a video enabled telemedicine application and verified that I am speaking with the correct person using two identifiers.   I discussed the limitations of evaluation and management by telemedicine and the availability of in person appointments. The patient expressed understanding and agreed to proceed.  Patient location: home Provider location: [place] office  Persons involved in the visit: patient, provider   HPI   Presents today for depression follow up. He was initially started on buspar 7.5mg  bid several months ago for depression. Then, two months ago he was changed to 50 mg zoloft daily which he reports is working well for him. Has recently put in two week notice and is looking for work in [place] and [place]. Needs 30 days to go to local [facility] and then remainder to [facility].   Social History   Tobacco Use  . Smoking status: Never Smoker  . Smokeless tobacco: Never Used  Substance Use Topics  . Alcohol use: Yes    Alcohol/week: 1.0 - 2.0 standard drinks    Types: 1 - 2 Standard drinks or equivalent per week  . Drug use: No    Review of Systems Per HPI unless specifically indicated above     Objective:    There were no vitals taken for this visit.  Wt Readings from Last 3 Encounters:  04/20/19 139 lb 9.6 oz (63.3 kg)  03/21/19 139 lb (63 kg)  05/13/16 130 lb (59 kg) (11 %, Z= -1.20)*   * Growth percentiles are based on CDC (Boys, 2-20 Years) data.    Physical Exam Results for orders placed or performed in visit on 05/13/16  POCT Urinalysis Dipstick  Result Value Ref Range   Color, UA yellow    Clarity, UA clear    Glucose, UA neg    Bilirubin, UA neg    Ketones, UA neg    Spec Grav, UA 1.025    Blood, UA neg    pH, UA 6.0    Protein, UA neg    Urobilinogen, UA 0.2    Nitrite, UA neg    Leukocytes, UA Negative Negative      Assessment & Plan:   Problem List Items Addressed This Visit      Other   Depression   Relevant Medications   sertraline (ZOLOFT) 50 MG tablet      Meds ordered this encounter  Medications  . DISCONTD: sertraline (ZOLOFT) 50 MG tablet    Sig: Take 1 tablet (50 mg total) by mouth daily.    Dispense:  30 tablet    Refill:  0    Order Specific Question:   Supervising Provider    Answer:   Birdie Sons [[id number]]  . sertraline (ZOLOFT) 50 MG tablet    Sig: Take 1 tablet (50 mg total) by mouth daily.    Dispense:  90 tablet    Refill:  1    Order Specific Question:   Supervising Provider    Answer:   Birdie Sons [[id number]]      Follow up plan: No follow-ups on file.  Carles Collet, PA-C [facility] 06/22/2019, 11:44 AM

## 2020-02-22 ENCOUNTER — Other Ambulatory Visit: Payer: Self-pay | Admitting: Physician Assistant

## 2020-02-22 DIAGNOSIS — F32A Depression, unspecified: Secondary | ICD-10-CM

## 2020-02-22 NOTE — Telephone Encounter (Signed)
Requested medications are due for refill today?  No   Requested medications are on active medication list?  Yes  Last Refill:   06/22/2019   # 90 with one refill  Future visit scheduled?  No   Notes to Clinic:  Medication failed Rx refill protocol due to no valid encounter in the last 6 months.  Last visit was 8 months ago.

## 2020-05-17 ENCOUNTER — Other Ambulatory Visit: Payer: Self-pay | Admitting: Physician Assistant

## 2020-05-17 DIAGNOSIS — F32A Depression, unspecified: Secondary | ICD-10-CM

## 2020-05-17 NOTE — Telephone Encounter (Signed)
Requested  medications are  due for refill today yes  Requested medications are on the active medication list yes  Last refill 6/12  Last visit Sept 2020  Future visit scheduled NO  Notes to clinic failed protocol due to no visit within 6 months

## 2020-05-21 NOTE — Telephone Encounter (Signed)
Patient is requesting a year supply of Zoloft 50 MG. L.O.V. was 06/22/2019, please advise.

## 2020-05-21 NOTE — Telephone Encounter (Signed)
I filled it for 30 days. That would get him to the one year mark from his last appointment. Cannot give additional refills without appointment.
